# Patient Record
Sex: Female | Born: 1980 | Race: White | Hispanic: Yes | Marital: Single | Smoking: Never smoker
Health system: Southern US, Community
[De-identification: ages and names within clinical notes are randomized; demographics above are authoritative.]

## PROBLEM LIST (undated history)

## (undated) DIAGNOSIS — O24419 Gestational diabetes mellitus in pregnancy, unspecified control: Secondary | ICD-10-CM

---

## 2010-04-18 ENCOUNTER — Inpatient Hospital Stay: Payer: Self-pay | Admitting: Obstetrics and Gynecology

## 2015-04-15 ENCOUNTER — Other Ambulatory Visit: Payer: Self-pay | Admitting: Advanced Practice Midwife

## 2015-04-15 DIAGNOSIS — Z3482 Encounter for supervision of other normal pregnancy, second trimester: Secondary | ICD-10-CM

## 2015-04-18 ENCOUNTER — Ambulatory Visit
Admission: RE | Admit: 2015-04-18 | Discharge: 2015-04-18 | Disposition: A | Discharge status: Home / Self Care | Payer: Medicaid Other | Source: Ambulatory Visit | Attending: Advanced Practice Midwife | Admitting: Advanced Practice Midwife

## 2015-04-18 ENCOUNTER — Other Ambulatory Visit: Payer: Self-pay | Admitting: Advanced Practice Midwife

## 2015-04-18 DIAGNOSIS — Z3A13 13 weeks gestation of pregnancy: Secondary | ICD-10-CM | POA: Insufficient documentation

## 2015-04-18 DIAGNOSIS — Z3482 Encounter for supervision of other normal pregnancy, second trimester: Secondary | ICD-10-CM | POA: Diagnosis present

## 2015-05-14 ENCOUNTER — Other Ambulatory Visit: Payer: Self-pay | Admitting: Advanced Practice Midwife

## 2015-05-14 DIAGNOSIS — Z3689 Encounter for other specified antenatal screening: Secondary | ICD-10-CM

## 2015-06-04 ENCOUNTER — Ambulatory Visit
Admission: RE | Admit: 2015-06-04 | Discharge: 2015-06-04 | Disposition: A | Discharge status: Home / Self Care | Payer: Medicaid Other | Source: Ambulatory Visit | Attending: Advanced Practice Midwife | Admitting: Advanced Practice Midwife

## 2015-06-04 ENCOUNTER — Encounter (INDEPENDENT_AMBULATORY_CARE_PROVIDER_SITE_OTHER): Payer: Self-pay

## 2015-06-04 DIAGNOSIS — Z36 Encounter for antenatal screening of mother: Secondary | ICD-10-CM | POA: Insufficient documentation

## 2015-06-04 DIAGNOSIS — Z3689 Encounter for other specified antenatal screening: Secondary | ICD-10-CM

## 2015-08-30 ENCOUNTER — Observation Stay
Admission: EM | Admit: 2015-08-30 | Discharge: 2015-08-31 | Disposition: A | Discharge status: Home / Self Care | Payer: Medicaid Other | Attending: Obstetrics and Gynecology | Admitting: Obstetrics and Gynecology

## 2015-08-30 DIAGNOSIS — O4400 Placenta previa specified as without hemorrhage, unspecified trimester: Principal | ICD-10-CM | POA: Insufficient documentation

## 2015-08-30 DIAGNOSIS — N939 Abnormal uterine and vaginal bleeding, unspecified: Secondary | ICD-10-CM | POA: Diagnosis present

## 2015-08-30 DIAGNOSIS — Z3A32 32 weeks gestation of pregnancy: Secondary | ICD-10-CM | POA: Insufficient documentation

## 2015-08-30 HISTORY — DX: Gestational diabetes mellitus in pregnancy, unspecified control: O24.419

## 2015-08-30 LAB — CBC
HCT: 33.9 % — ABNORMAL LOW (ref 35.0–47.0)
Hemoglobin: 11.5 g/dL — ABNORMAL LOW (ref 12.0–16.0)
MCH: 30.7 pg (ref 26.0–34.0)
MCHC: 33.7 g/dL (ref 32.0–36.0)
MCV: 90.9 fL (ref 80.0–100.0)
PLATELETS: 156 10*3/uL (ref 150–440)
RBC: 3.73 MIL/uL — AB (ref 3.80–5.20)
RDW: 13.6 % (ref 11.5–14.5)
WBC: 7.5 10*3/uL (ref 3.6–11.0)

## 2015-08-30 MED ORDER — SODIUM CHLORIDE 0.9 % IJ SOLN
INTRAMUSCULAR | Status: AC
Start: 2015-08-30 — End: 2015-08-31
  Filled 2015-08-30: qty 3

## 2015-08-30 MED ORDER — TERBUTALINE SULFATE 1 MG/ML IJ SOLN
0.2500 mg | Freq: Once | INTRAMUSCULAR | Status: AC
  Administered 2015-08-30: 0.25 mg via SUBCUTANEOUS
  Filled 2015-08-30: qty 1

## 2015-08-30 NOTE — OB Triage Note (Signed)
Pt. states she was showering at approximately 1830 this evening when she felt blood running down her leg. She then sat on the toilet and felt a clot slide out, but she did not see how big it was. States "it was a lot" but bleeding is tapering off now. Reports good fetal movement since this incident.

## 2015-08-31 DIAGNOSIS — O4400 Placenta previa specified as without hemorrhage, unspecified trimester: Secondary | ICD-10-CM | POA: Diagnosis not present

## 2015-08-31 MED ORDER — TERBUTALINE SULFATE 1 MG/ML IJ SOLN
0.2500 mg | INTRAMUSCULAR | Status: DC | PRN
  Administered 2015-08-31: 0.25 mg via SUBCUTANEOUS

## 2015-08-31 MED ORDER — BETAMETHASONE SOD PHOS & ACET 6 (3-3) MG/ML IJ SUSP
12.0000 mg | INTRAMUSCULAR | Status: DC
  Administered 2015-08-31 – 2015-09-01 (×2): 12 mg via INTRAMUSCULAR
  Filled 2015-08-31: qty 2

## 2015-08-31 NOTE — OB Triage Note (Signed)
Seen today by Dr. Feliberto GottronSchermerhorn. Placenta previa. Gabrielle Benitez, [name]

## 2015-08-31 NOTE — Discharge Summary (Signed)
Obstetric Discharge Summary Reason for Admission: 32 week placenta previa with bleeding  Prenatal Procedures: none Intrapartum Procedures: n/a Postpartum Procedures: none Complications-Operative and Postpartum: n/a HEMOGLOBIN  Date Value Ref Range Status  08/30/2015 11.5* 12.0 - 16.0 g/dL Final   HCT  Date Value Ref Range Status  08/30/2015 33.9* 35.0 - 47.0 % Final    Physical Exam:  General: alert and cooperative Lochia: no bleeding on d/c  Uterine Fundus: soft Incision: n/a DVT Evaluation: No evidence of DVT seen on physical exam.  Discharge Diagnoses: Placenta previa, 32 weeks Reassuring fetal monitoring   Discharge Information: Date: 08/31/2015 Activity: pelvic rest Diet: routine Medications: betamethasone 12.5 mg , ferrous sulfate  Condition: stable  Instructions: refer to practice specific booklet Discharge to: home   Newborn Data:   SCHERMERHORN,THOMAS 08/31/2015, 8:32 AM

## 2015-08-31 NOTE — Discharge Instructions (Signed)
Seen today by Dr. Feliberto GottronSchermerhorn. Placenta previa. Received Betamethasone. Follow-up with your provider this week. Make them aware you were seen here for care. Elaina HoopsElks, Marcille Barman S

## 2015-08-31 NOTE — Progress Notes (Addendum)
Patient ID: Gabrielle Benitez, female   DOB: 09/17/1981, 34 y.o.   MRN: [id number] Gabrielle Benitez 09/17/1981 G5 P3 [id number] based on 13 week u/s ( LMP end on MArch ) presents for vaginal bleeding last pm . Blood was running down leg . Pt has a known placenta previa and [facility]  She denies intercourse and did not have heavy exertion yesterday . Pregnancy complicated by A1GDM  . No insurance and is an unassigned pt  No LOF. Pt had rare ctx  And received terbutaline SQ . No bleeding this am . No abd pain .  PMHX : no  PSHX ; none POBhx : svd x3  ROS: unremarkable  Allergies NKDA Social : no etoh , no tobacco  O;BP 90/44 mmHg  Pulse 85  Temp(Src) 98.4 F (36.9 C) (Oral)  Resp 16  Ht 5' (1.524 m)  Wt 145 lb (65.772 kg)  BMI 28.32 kg/m2  LMP 01/14/2015 ABD soft , NT  Lungs CTA  CV RRR CX not checked  NST Reactive , 140's , no decels   Labs: HCT 33.9,  BT o+ A: placenta previa  With bleeding episode . Stable this am . P:D/C with precautions . Pt will receive Betamethasone  12.5 today and repeat in am tomorrow . She is instructed to return to her OB providers . If bleeding starts up again she should proceed to the nearest L+D   Add ferrous sulfate 325 mg qd + Vit C 500 mg

## 2015-09-01 NOTE — Plan of Care (Signed)
September 01, 2015 0900 am [facility].  Given in right glut. 12 mg IM as ordered per MD. Pt tolerated well. No complaints from previous injection. Leaving dept in stable condition. Ellison Carwin Shamonique Battiste RNC

## 2016-05-16 IMAGING — US US OB TRANSVAGINAL
1 series · 14 of 28 positions shown · non-contrast
Comparison: None.

CLINICAL DATA: Prenatal care/dating. Patient reports history of
intrauterine device.

EXAM:
OBSTETRIC <14 WK US AND TRANSVAGINAL OB US
TECHNIQUE: Both transabdominal and transvaginal ultrasound examinations were
performed for complete evaluation of the gestation as well as the
maternal uterus, adnexal regions, and pelvic cul-de-sac.
Transvaginal technique was performed to assess early pregnancy.

[Series 1: us ob transvaginal · 0.26mm/px · 98 acquisitions, 14 frames shown]
[im 4/98]
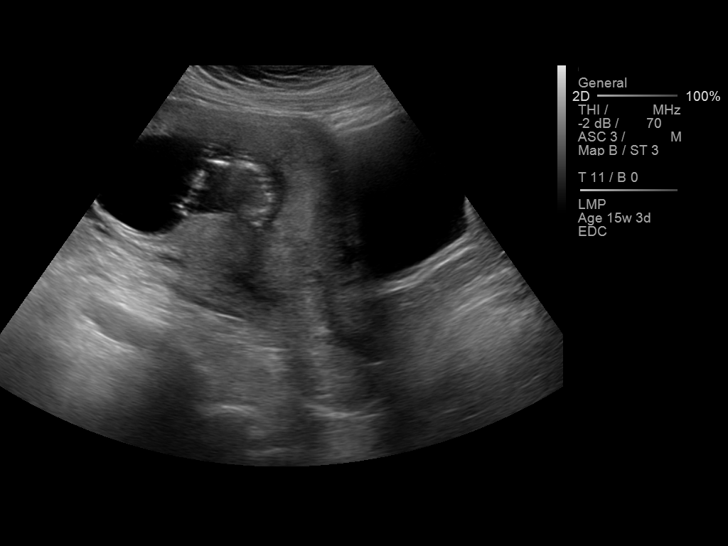
[im 11/98]
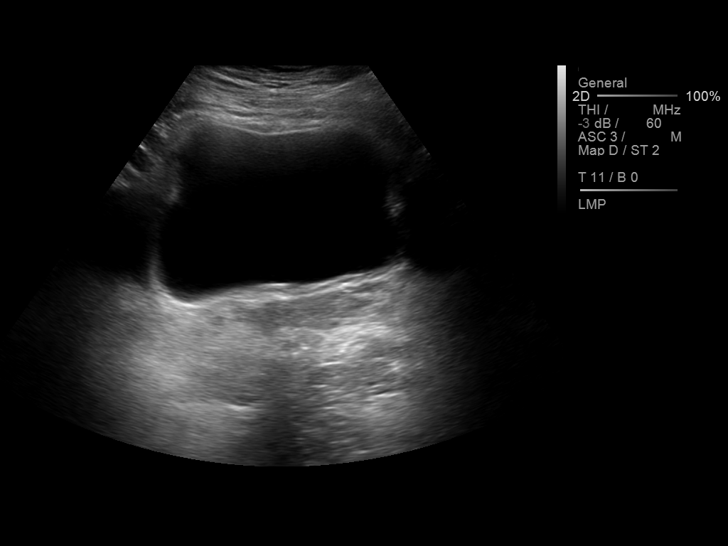
[im 18/98]
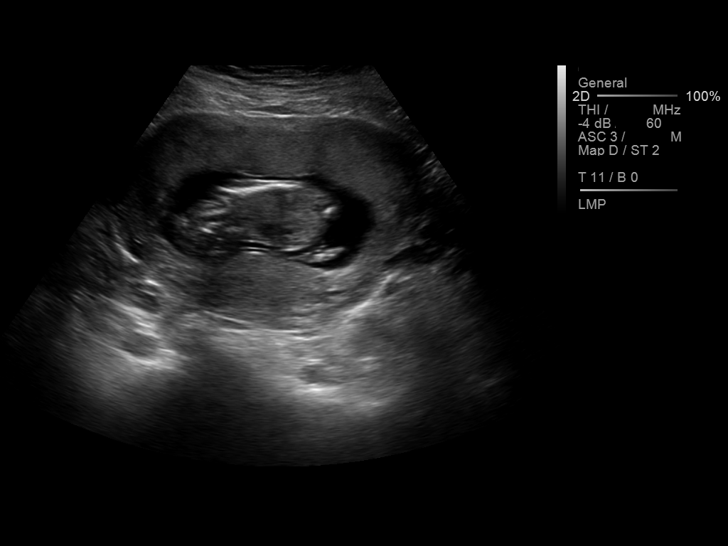
[im 26/98]
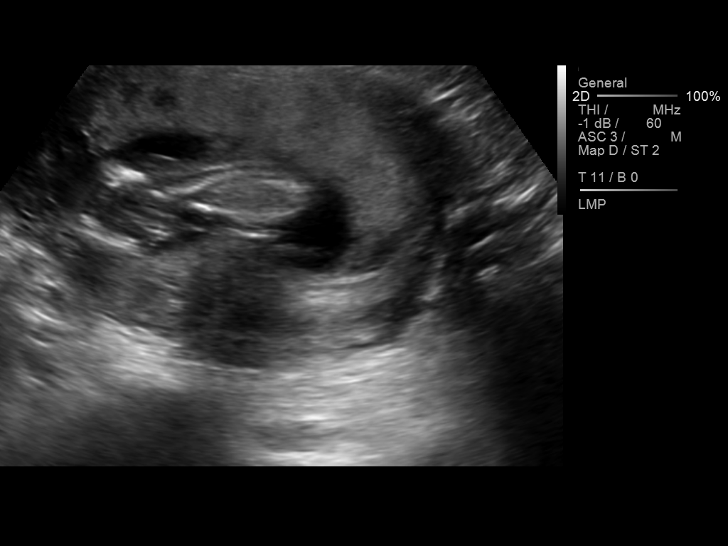
[im 33/98]
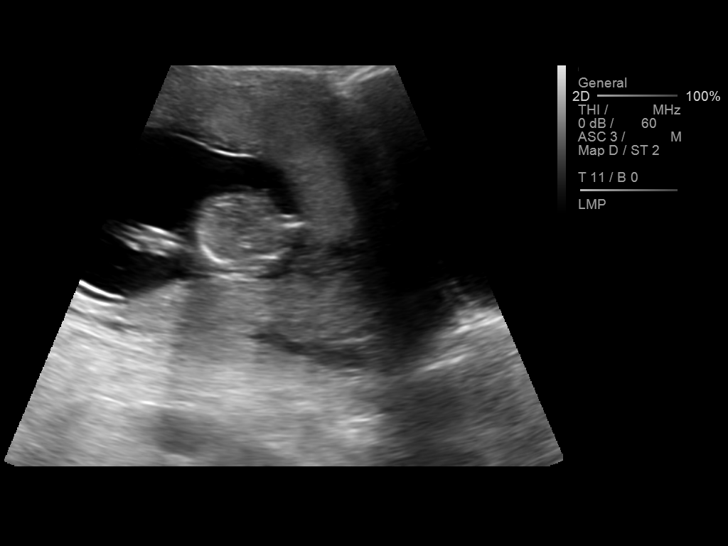
[im 40/98]
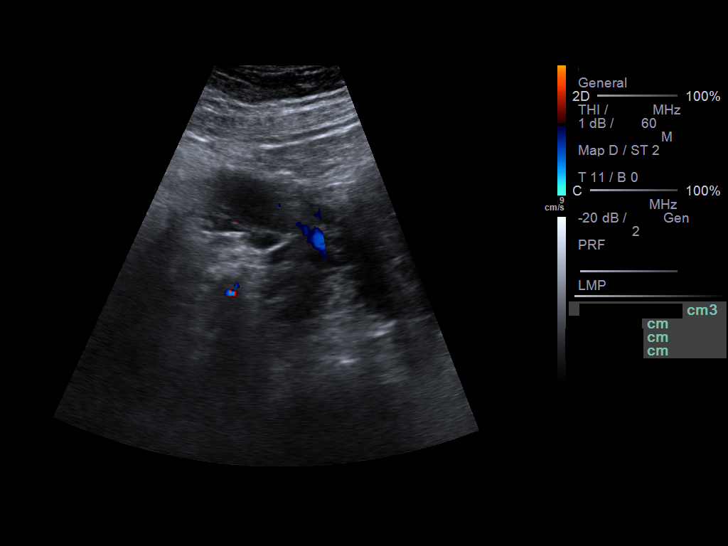
[im 47/98]
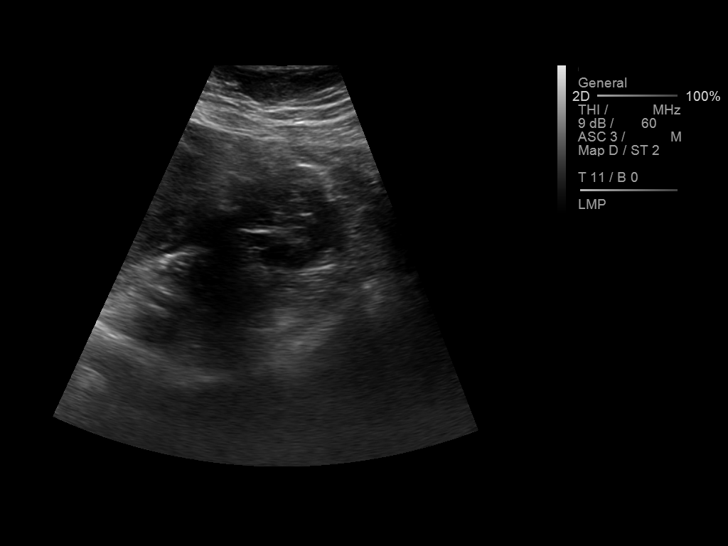
[im 54/98]
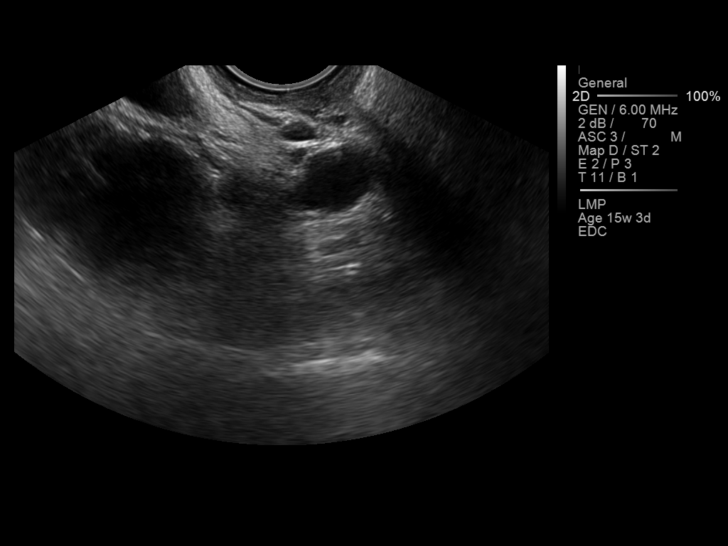
[im 62/98]
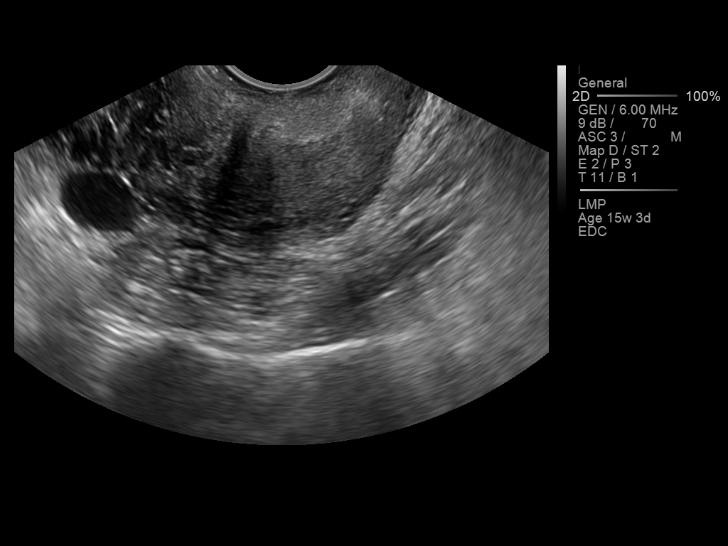
[im 69/98]
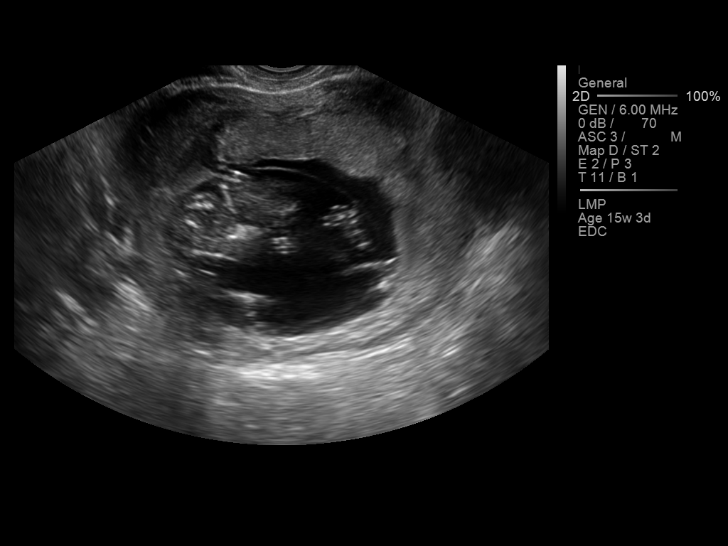
[im 76/98]
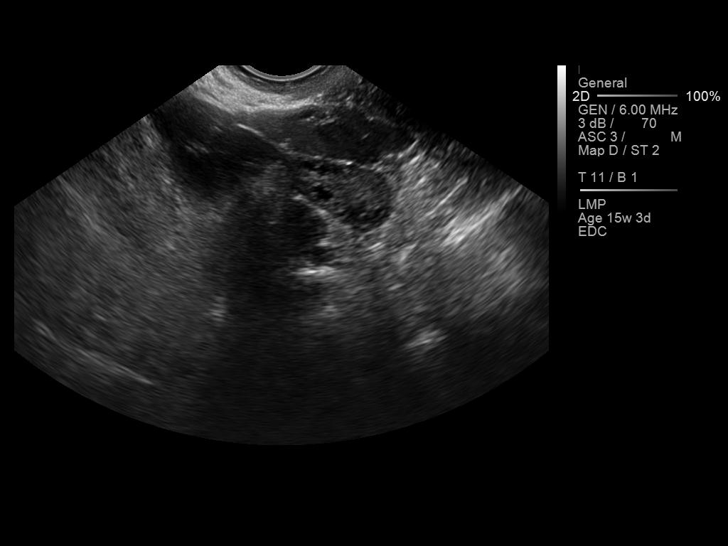
[im 83/98]
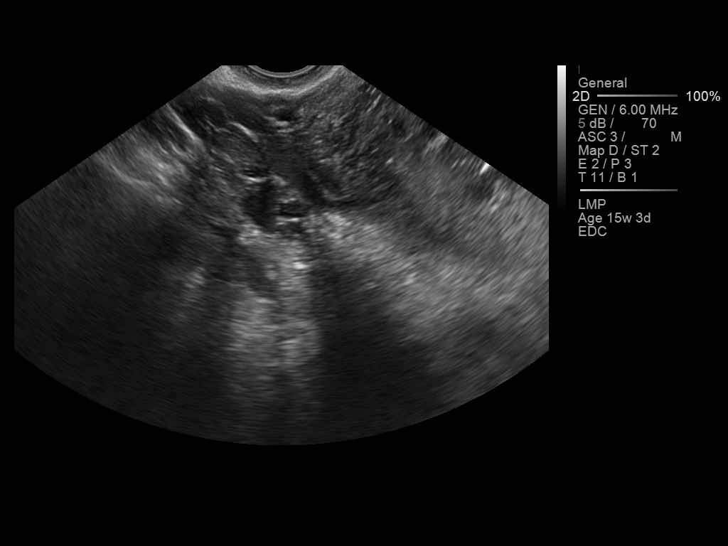
[im 90/98]
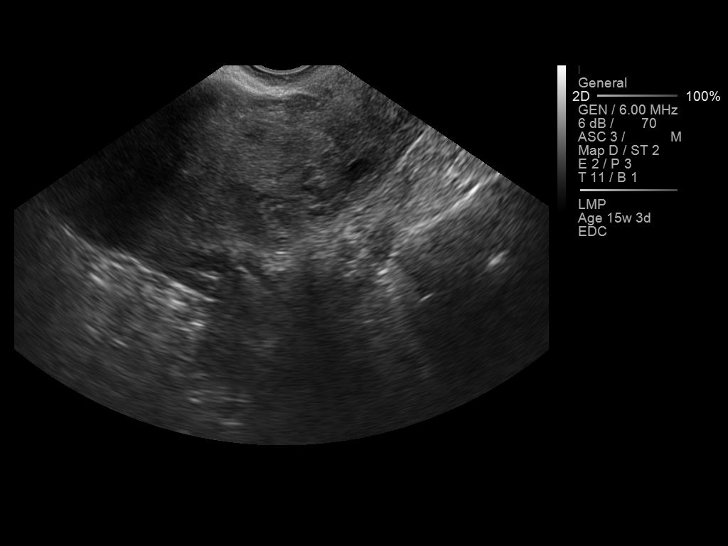
[im 98/98]
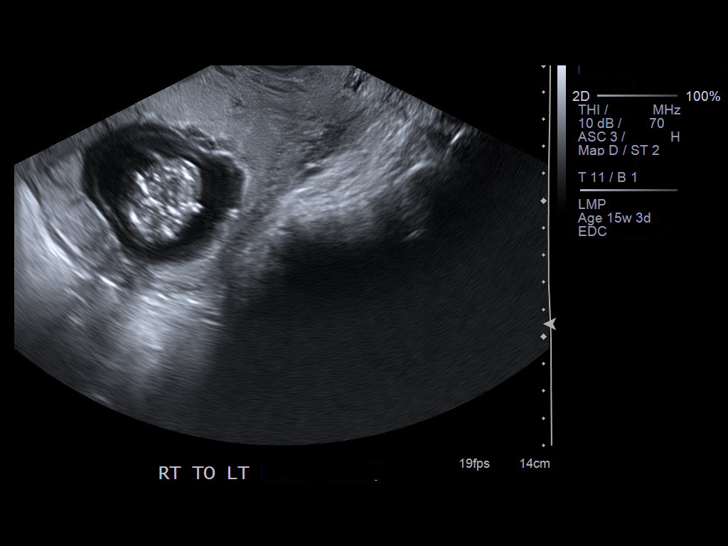

[14 of 28 positions shown; findings below may reference images not displayed]

FINDINGS: Intrauterine gestational sac: Visualized/normal in shape.

Yolk sac:  Present

Embryo:  Present

Cardiac Activity: Present

Heart Rate: 153  Bpm

CRL:  73  Mm   13 w   3 d                  US EDC: 10/21/2015

Maternal uterus/adnexae: Normal right and left ovaries. No
subchorionic hemorrhage. No free fluid in the pelvis. No definite
intrauterine device is visualized on current evaluation.
IMPRESSION: Single live intrauterine gestation 13 weeks 3 days by crown-rump
length.

## 2016-07-02 IMAGING — US US OB COMP +14 WK
1 series · 13 of 28 positions shown · non-contrast
Comparison: none

CLINICAL DATA: 34-year-old gravida 5 para 3 AB1. Gestational age by
first ultrasound is 20 weeks 1 day. Assigned EDC is 10/21/2015. Exam
performed for anatomy.

EXAM:
OBSTETRIC 14+ WK ULTRASOUND

[Series 1: us ob comp +14 wk · 0.21mm/px · 13 of 96 slices shown]
[im 4/96]
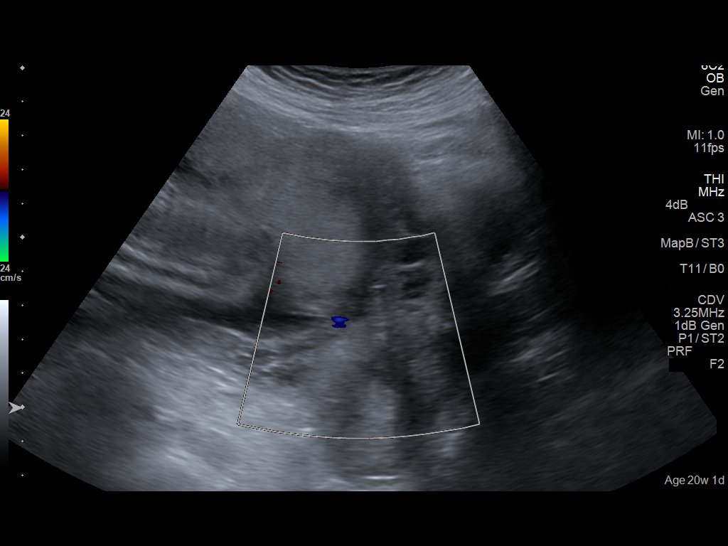
[im 11/96]
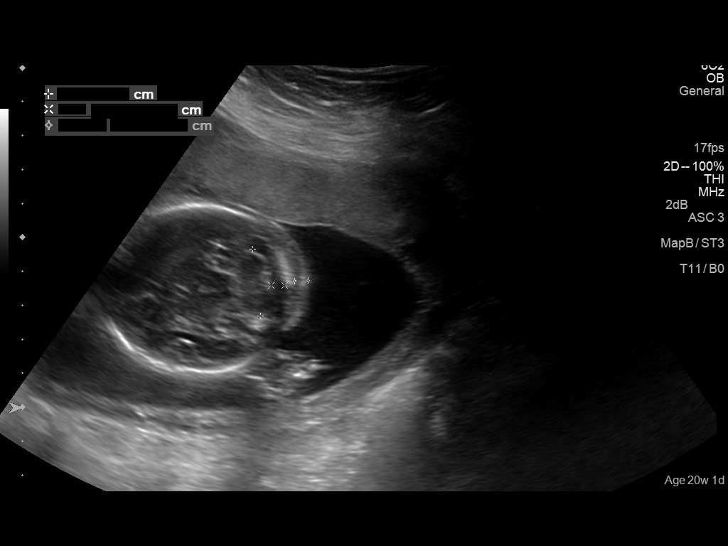
[im 18/96]
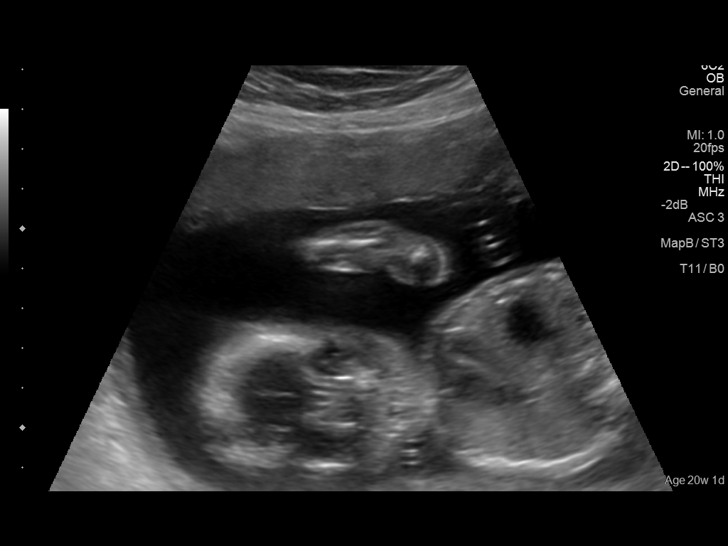
[im 25/96]
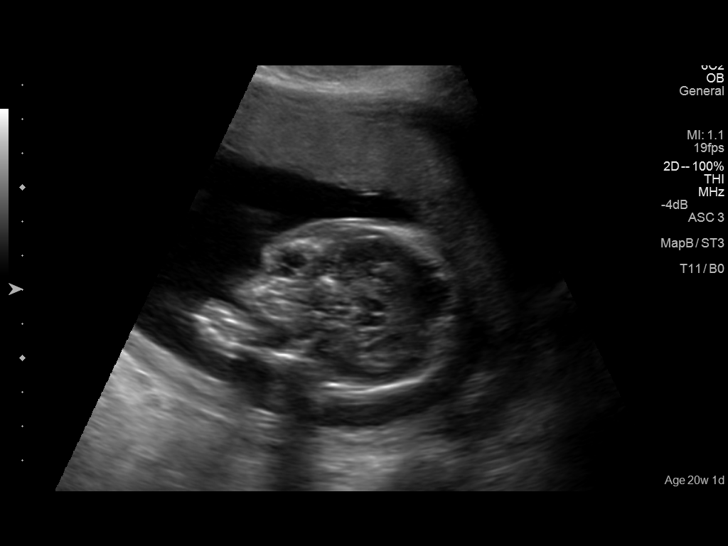
[im 32/96]
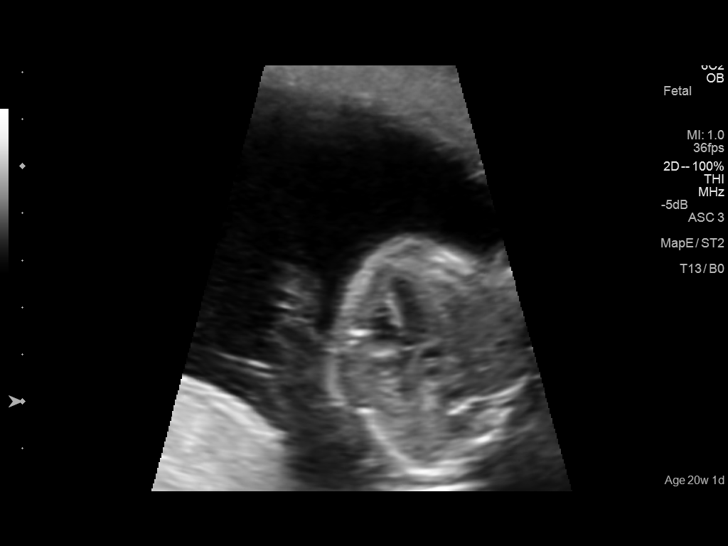
[im 39/96]
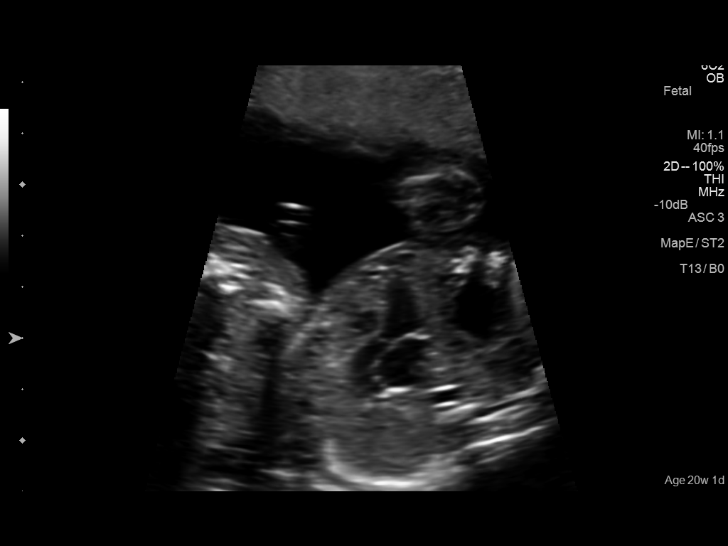
[im 50/96]
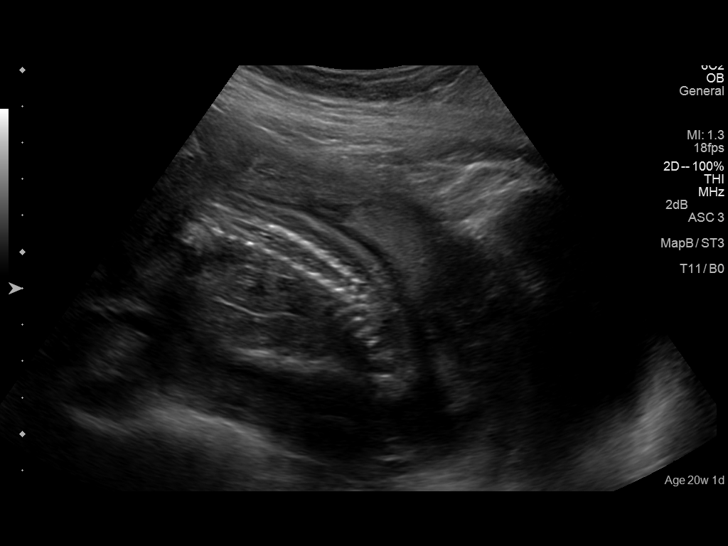
[im 57/96]
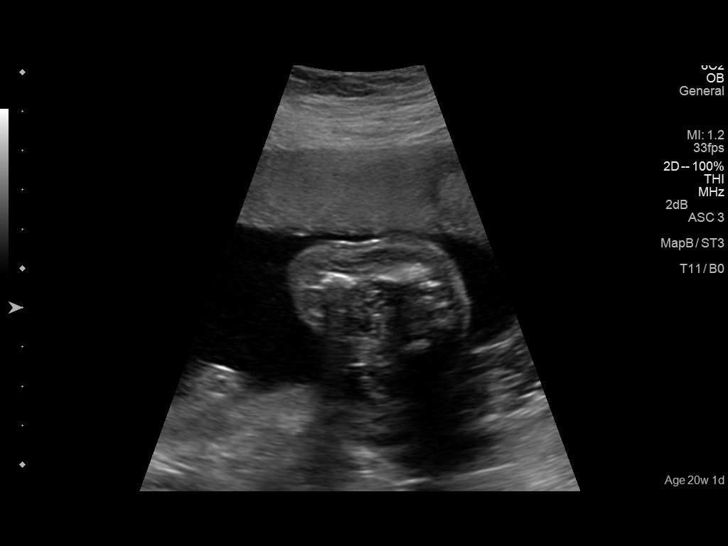
[im 64/96]
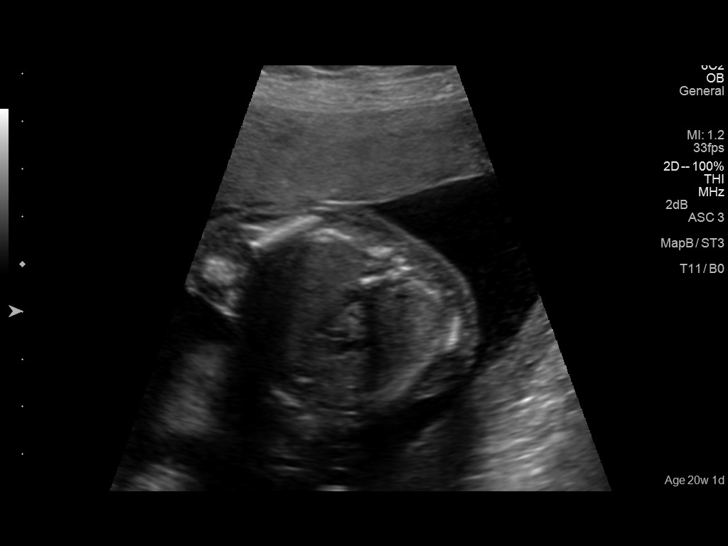
[im 71/96]
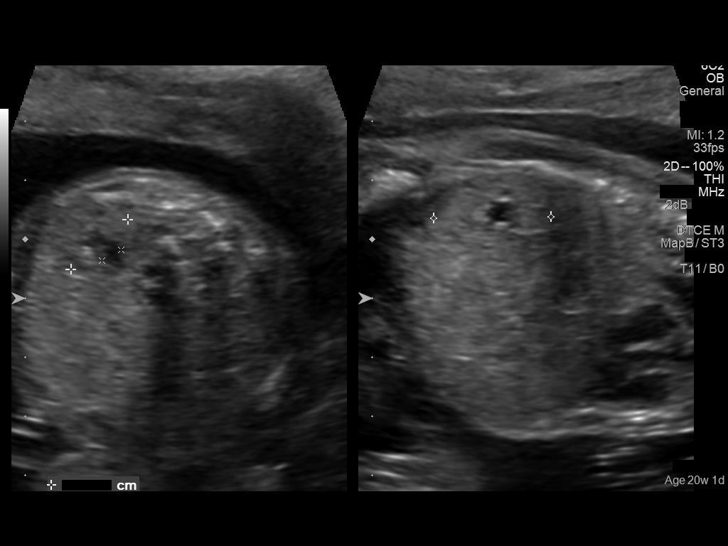
[im 78/96]
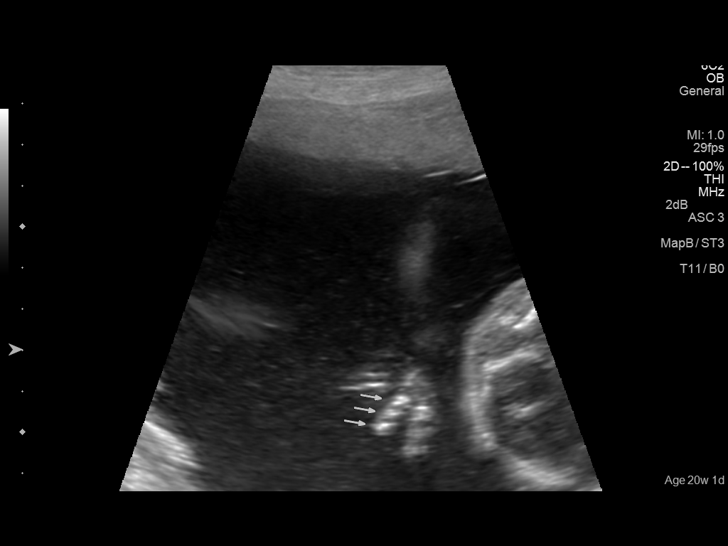
[im 85/96]
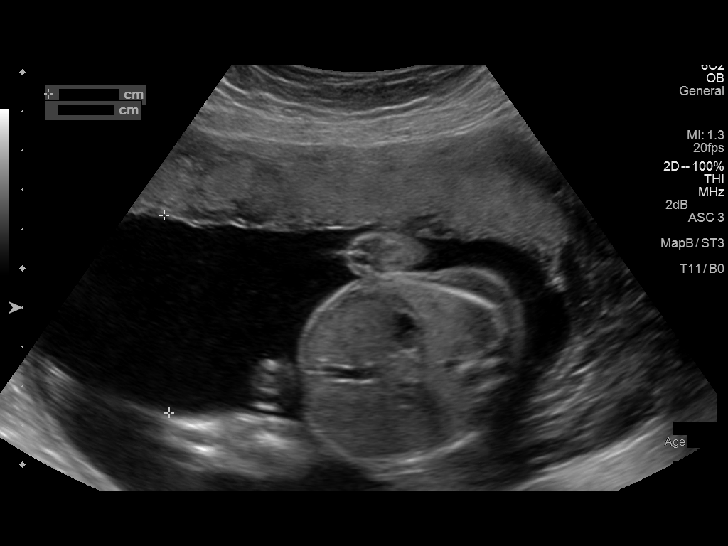
[im 92/96]
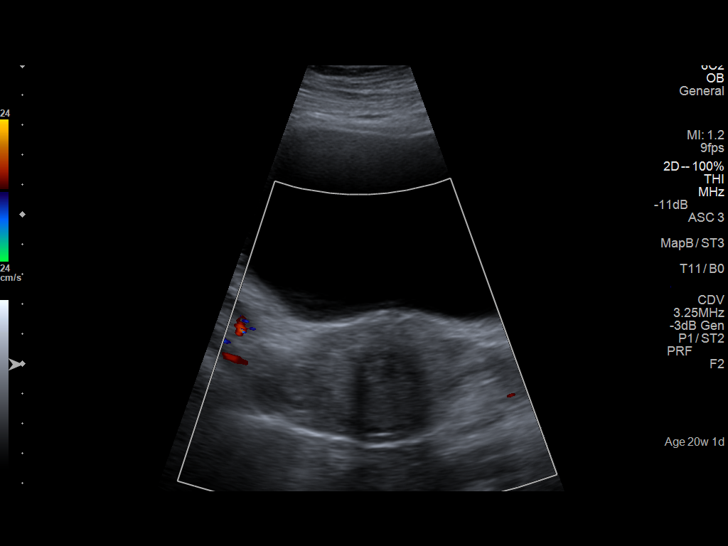

[13 of 28 positions shown; findings below may reference images not displayed]

FINDINGS: Number of Fetuses: 1

Heart Rate:  144 bpm

Movement: Present

Presentation: Breech

Previa: Complete, anterior. There is vascularity in the region of
the internal cervical os. However normal anterior placental cord
insertion is identified, excluding vasa previa.

Placental Location: Anterior

Amniotic Fluid (Subjective): Normal

Amniotic Fluid (Objective):

Vertical pocket 5.0cm

FETAL BIOMETRY

BPD:  4.8cm 20w 4d

HC:    17.3cm  19w   6d

AC:   16.2cm  21w   2d

FL:   3.4cm  20w   4d

Current Mean GA: 20w 5d              US EDC: 10/17/2015

FETAL ANATOMY

Lateral Ventricles: Visualized

Thalami/CSP: Visualized

Posterior Fossa:  Visualized

Nuchal Region: Visualized    NFT= 4mm

Upper Lip: Visualized

Spine: Visualized

4 Chamber Heart on Left: Visualized

LVOT: Visualized

RVOT: Visualized

Stomach on Left: Visualized

3 Vessel Cord: Visualized

Cord Insertion site: Visualized

Kidneys: Visualized

Bladder: Visualized

Extremities: Visualized

Sex: Visualized

Technically difficult due to: NA

Maternal Findings:

Cervix:  6.1 cm

Intrauterine device is not identified.
IMPRESSION: 1. Single living intrauterine fetus in breech presentation.
2. Size and dates correlate well.
3. No fetal anomalies are identified.
4. Complete previa.  Follow-up is recommended.

## 2016-07-04 ENCOUNTER — Encounter (HOSPITAL_COMMUNITY): Payer: Self-pay
